# Patient Record
Sex: Male | Born: 1975 | Race: White | Hispanic: No | Marital: Married | Smoking: Current every day smoker
Health system: Southern US, Community
[De-identification: ages and names within clinical notes are randomized; demographics above are authoritative.]

## PROBLEM LIST (undated history)

## (undated) HISTORY — PX: CARPAL TUNNEL RELEASE: SHX101

---

## 2014-07-13 ENCOUNTER — Emergency Department (HOSPITAL_COMMUNITY): Payer: BC Managed Care – PPO

## 2014-07-13 ENCOUNTER — Emergency Department (HOSPITAL_COMMUNITY)
Admission: EM | Admit: 2014-07-13 | Discharge: 2014-07-13 | Disposition: A | Discharge status: Home / Self Care | Payer: BC Managed Care – PPO | Attending: Emergency Medicine | Admitting: Emergency Medicine

## 2014-07-13 ENCOUNTER — Encounter (HOSPITAL_COMMUNITY): Payer: Self-pay | Admitting: Emergency Medicine

## 2014-07-13 DIAGNOSIS — W278XXA Contact with other nonpowered hand tool, initial encounter: Secondary | ICD-10-CM | POA: Insufficient documentation

## 2014-07-13 DIAGNOSIS — Z23 Encounter for immunization: Secondary | ICD-10-CM | POA: Insufficient documentation

## 2014-07-13 DIAGNOSIS — Y929 Unspecified place or not applicable: Secondary | ICD-10-CM | POA: Insufficient documentation

## 2014-07-13 DIAGNOSIS — F17200 Nicotine dependence, unspecified, uncomplicated: Secondary | ICD-10-CM | POA: Insufficient documentation

## 2014-07-13 DIAGNOSIS — S61209A Unspecified open wound of unspecified finger without damage to nail, initial encounter: Secondary | ICD-10-CM | POA: Insufficient documentation

## 2014-07-13 DIAGNOSIS — Y9389 Activity, other specified: Secondary | ICD-10-CM | POA: Insufficient documentation

## 2014-07-13 DIAGNOSIS — S61411A Laceration without foreign body of right hand, initial encounter: Secondary | ICD-10-CM

## 2014-07-13 MED ORDER — DIPHTH-ACELL PERTUSSIS-TETANUS 25-58-10 LF-MCG/0.5 IM SUSP
0.5000 mL | Freq: Once | INTRAMUSCULAR | Status: AC
  Administered 2014-07-13: 0.5 mL via INTRAMUSCULAR
  Filled 2014-07-13: qty 0.5

## 2014-07-13 MED ORDER — CEPHALEXIN 500 MG PO CAPS: 500.0000 mg | ORAL_CAPSULE | Freq: Two times a day (BID) | ORAL | Status: DC

## 2014-07-13 MED ORDER — HYDROCODONE-ACETAMINOPHEN 5-325 MG PO TABS
1.0000 | ORAL_TABLET | Freq: Once | ORAL | Status: AC
  Administered 2014-07-13: 1 via ORAL
  Filled 2014-07-13: qty 1

## 2014-07-13 MED ORDER — LIDOCAINE HCL (PF) 2 % IJ SOLN
INTRAMUSCULAR | Status: AC
  Filled 2014-07-13: qty 10

## 2014-07-13 MED ORDER — BACITRACIN-NEOMYCIN-POLYMYXIN 400-5-5000 EX OINT
TOPICAL_OINTMENT | CUTANEOUS | Status: AC
  Filled 2014-07-13: qty 1

## 2014-07-13 MED ORDER — CEPHALEXIN 500 MG PO CAPS
500.0000 mg | ORAL_CAPSULE | Freq: Once | ORAL | Status: AC
  Administered 2014-07-13: 500 mg via ORAL
  Filled 2014-07-13: qty 1

## 2014-07-13 NOTE — Discharge Instructions (Signed)
Sutures out in 10 days.  Clean laceration twice a day with soap and water

## 2014-07-13 NOTE — ED Notes (Signed)
Pt updated on wait- radiology downtime, xray results delayed

## 2014-07-13 NOTE — ED Provider Notes (Signed)
CSN: [id number]     Arrival date & time 07/13/14  1217 History  This chart was scribed for Jared LennertJoseph L Lurie Mullane, MD by Leona CarryG. Clay Sherrill, ED Scribe. The patient was seen in [facility]. The patient's care was started at 1:17 PM.     Chief Complaint  Patient presents with  . Laceration   Patient is a 38 y.o. male presenting with skin laceration. The history is provided by the patient. No language interpreter was used.  Laceration Location:  Hand Hand laceration location:  R finger Length (cm):  1 cm Bleeding: controlled   Injury mechanism: chainsaw. Ineffective treatments:  None tried Tetanus status:  Out of date  HPI Comments: Jared GandyJohn D Lindsey is a 38 y.o. male who presents to the Emergency Department complaining of a laceration to his right thumb and index finger. Patient reports that the laceration occurred immediately PTA when he was working with a chainsaw at home. He reports that his tetanus is out of date.   Patient does not have a PCP.   History reviewed. No pertinent past medical history. Past Surgical History  Procedure Laterality Date  . Carpal tunnel release     No family history on file. History  Substance Use Topics  . Smoking status: Current Every Day Smoker  . Smokeless tobacco: Current User  . Alcohol Use: Yes     Comment: social     Review of Systems  Constitutional: Negative for appetite change and fatigue.  HENT: Negative for congestion, ear discharge and sinus pressure.   Eyes: Negative for discharge.  Respiratory: Negative for cough.   Cardiovascular: Negative for chest pain.  Gastrointestinal: Negative for abdominal pain and diarrhea.  Genitourinary: Negative for frequency and hematuria.  Musculoskeletal: Negative for back pain.  Skin: Positive for wound. Negative for rash.  Neurological: Negative for seizures and headaches.  Psychiatric/Behavioral: Negative for hallucinations.      Allergies  Erythromycin  Home Medications   Prior to Admission  medications   Not on File   Triage Vitals: BP 121/77  Pulse 94  Temp(Src) 98.7 F (37.1 C) (Oral)  Resp 18  Ht 6\' 2"  (1.88 m)  Wt 220 lb (99.791 kg)  BMI 28.23 kg/m2  SpO2 98% Physical Exam  Constitutional: He is oriented to person, place, and time. He appears well-developed.  HENT:  Head: Normocephalic.  Eyes: Conjunctivae are normal.  Neck: No tracheal deviation present.  Cardiovascular:  No murmur heard. Musculoskeletal: Normal range of motion.  Neurological: He is oriented to person, place, and time.  Skin: Skin is warm.  1 cm laceration ventrally to index finger of right hand. Right thumb 1 cm laceration posterior. Neurovascular exam normal on both fingers.   Psychiatric: He has a normal mood and affect.    ED Course  LACERATION REPAIR Date/Time: 07/13/2014 3:31 PM Performed by: Hiroki Wint L Authorized by: Bethann BerkshireZAMMIT, Angella Montas L Comments: Pt had  Two  1.5 cm lacerations.  One on right index and one on right thumb.  Both lacerations were numbed with lido no epi.  Cleaned with betadine and 3,  4-0 nylon sutures were used to close each laceration.  The pt tolerated the procedure well   (including critical care time) DIAGNOSTIC STUDIES: Oxygen Saturation is 97% on room air, normal by my interpretation.    COORDINATION OF CARE: 1:19 PM-Discussed treatment plan which includes Infanrix, Vicodin, and a right hand x-ray with pt at bedside and pt agreed to plan.     Labs Review Labs Reviewed -  No data to display  Imaging Review No results found.   EKG Interpretation None      MDM   Final diagnoses:  None    The chart was scribed for me under my direct supervision.  I personally performed the history, physical, and medical decision making and all procedures in the evaluation of this patient.Jared Lennert, MD 07/13/14 [phone]

## 2014-07-13 NOTE — ED Notes (Signed)
Unable to take temp pt drinking water.

## 2014-07-13 NOTE — ED Notes (Signed)
Dr Estell HarpinZammit in room with patient putting in stiches unable to do vitals at this time.

## 2014-07-13 NOTE — ED Notes (Signed)
Pt was using a skil saw when it "kicked" back cutting his right thumb and right index finger, cms intact distal, laceration to right index finger continues to bleed, pressure applied,

## 2016-08-31 ENCOUNTER — Ambulatory Visit (INDEPENDENT_AMBULATORY_CARE_PROVIDER_SITE_OTHER): Payer: BLUE CROSS/BLUE SHIELD

## 2016-08-31 ENCOUNTER — Ambulatory Visit (HOSPITAL_COMMUNITY)
Admission: RE | Admit: 2016-08-31 | Discharge: 2016-08-31 | Disposition: A | Discharge status: Home / Self Care | Payer: BLUE CROSS/BLUE SHIELD | Source: Ambulatory Visit | Attending: Orthopaedic Surgery | Admitting: Orthopaedic Surgery

## 2016-08-31 ENCOUNTER — Ambulatory Visit (INDEPENDENT_AMBULATORY_CARE_PROVIDER_SITE_OTHER): Payer: Self-pay | Admitting: Orthopaedic Surgery

## 2016-08-31 ENCOUNTER — Encounter: Payer: Self-pay | Admitting: Orthopaedic Surgery

## 2016-08-31 VITALS — BP 138/92 | HR 92 | Temp 98.1°F | Ht 74.0 in | Wt 227.0 lb

## 2016-08-31 DIAGNOSIS — M2241 Chondromalacia patellae, right knee: Secondary | ICD-10-CM | POA: Insufficient documentation

## 2016-08-31 DIAGNOSIS — M25561 Pain in right knee: Secondary | ICD-10-CM

## 2016-08-31 DIAGNOSIS — X58XXXA Exposure to other specified factors, initial encounter: Secondary | ICD-10-CM | POA: Insufficient documentation

## 2016-08-31 DIAGNOSIS — F17210 Nicotine dependence, cigarettes, uncomplicated: Secondary | ICD-10-CM

## 2016-08-31 DIAGNOSIS — S76111A Strain of right quadriceps muscle, fascia and tendon, initial encounter: Secondary | ICD-10-CM | POA: Diagnosis not present

## 2016-08-31 DIAGNOSIS — S76121A Laceration of right quadriceps muscle, fascia and tendon, initial encounter: Secondary | ICD-10-CM | POA: Diagnosis not present

## 2016-08-31 MED ORDER — HYDROCODONE-ACETAMINOPHEN 7.5-325 MG PO TABS: ORAL_TABLET | ORAL | 0 refills | Status: AC

## 2016-08-31 NOTE — Progress Notes (Signed)
Patient KG:MWNU:Lupe D Tung, male DOB:[birth year], 40 y.o. UVO:[id number]  Chief Complaint  Patient presents with  . Knee Pain    right knee pain    HPI  Beulah GandyJohn D Langford is a 40 y.o. male who injured his right knee on his tractor a couple of days ago.  He hit his right knee.  He had pain for a few minutes but then was able to keep going.  He limped to the house.  He used ice.  Yesterday he awoke with pain but kept going.  He found he was limping severely and if he bends his knee he has marked pain right above the patella on the right knee.  He has swelling.  He has great difficulty walking.  He has used ice, rest and elevation as well as Advil with no help.  Today the pain was so severe he finally listened to his wife and called for an appointment.  He can hardly walk.  He is a smoker.  I have told him he needs to cut back or quit.  He will consider this. HPI  Body mass index is 29.15 kg/m.  ROS  Review of Systems  HENT: Negative for congestion.   Respiratory: Negative for cough and shortness of breath.   Cardiovascular: Negative for chest pain and leg swelling.  Endocrine: Negative for cold intolerance.  Musculoskeletal: Positive for arthralgias, gait problem and joint swelling.  Allergic/Immunologic: Negative for environmental allergies.    History reviewed. No pertinent past medical history.  Past Surgical History:  Procedure Laterality Date  . CARPAL TUNNEL RELEASE      He has recent spider bite and is on antibiotics for that.  History of heart disease and hypertension in the family.  Social History Social History  Substance Use Topics  . Smoking status: Current Every Day Smoker  . Smokeless tobacco: Current User    Types: Snuff  . Alcohol use Yes     Comment: social     Allergies  Allergen Reactions  . Erythromycin Nausea And Vomiting    Current Outpatient Prescriptions  Medication Sig Dispense Refill  . sulfamethoxazole-trimethoprim (BACTRIM DS,SEPTRA DS)  800-160 MG tablet Take 1 tablet by mouth 2 (two) times daily.    Marland Kitchen. HYDROcodone-acetaminophen (NORCO) 7.5-325 MG tablet One every four hours for pain as needed.  Do not drive car or operate machinery while taking this medicine.  Must last 14 days. 56 tablet 0   No current facility-administered medications for this visit.      Physical Exam  Blood pressure (!) 138/92, pulse 92, temperature 98.1 F (36.7 C), height 6\' 2"  (1.88 m), weight 227 lb (103 kg).  Constitutional: overall normal hygiene, normal nutrition, well developed, normal grooming, normal body habitus. Assistive device:none  Musculoskeletal: gait and station Limp marked on the right, muscle tone and strength are normal, no tremors or atrophy is present.  .  Neurological: coordination overall normal.  Deep tendon reflex/nerve stretch intact.  Sensation normal.  Cranial nerves II-XII intact.   Skin:   Normal overall no scars, lesions, ulcers or rashes. No psoriasis.  Psychiatric: Alert and oriented x 3.  Recent memory intact, remote memory unclear.  Normal mood and affect. Well groomed.  Good eye contact.  Cardiovascular: overall no swelling, no varicosities, no edema bilaterally, normal temperatures of the legs and arms, no clubbing, cyanosis and good capillary refill.  Lymphatic: palpation is normal.  The right knee is very, very tender over the patella proximally with swelling.  I feel  a small defect in the quadriceps tendon insertion on the patella.  He is so tender I cannot palpate as deeply as I would like.  He cannot bend the knee past 5 degrees without severe pain.  He cannot do a straight leg raise with the right knee.  He is in marked pain.  I am very concerned about a tear of the quadriceps tendon at the patella level.  I will order an urgent MRI of the knee.  He may need surgery.  He could have a small hematoma and not a tear or have an incomplete tear.  The patient has been educated about the nature of the problem(s)  and counseled on treatment options.  The patient appeared to understand what I have discussed and is in agreement with it.  X-rays were done and reported separately.  Encounter Diagnoses  Name Primary?  . Right knee pain Yes  . Quadriceps tendon rupture, right, initial encounter   . Cigarette nicotine dependence without complication     PLAN Call if any problems.  Precautions discussed.  Knee immobilizer given.  Rx for crutches given.  Rx for pain given with instructions.  Return to clinic tomorrow. To get MRI tonight as urgent add on procedure.   Electronically Signed Darreld Mclean, MD 9/20/20173:51 PM

## 2016-08-31 NOTE — Patient Instructions (Signed)
Wear Knee Immobilizer  Use Ice  Get crutches  MRI of the right knee  Call if worse.

## 2016-09-01 ENCOUNTER — Ambulatory Visit (INDEPENDENT_AMBULATORY_CARE_PROVIDER_SITE_OTHER): Payer: BLUE CROSS/BLUE SHIELD | Admitting: Orthopaedic Surgery

## 2016-09-01 ENCOUNTER — Encounter: Payer: Self-pay | Admitting: Orthopaedic Surgery

## 2016-09-01 VITALS — Ht 74.0 in | Wt 227.0 lb

## 2016-09-01 DIAGNOSIS — S76111D Strain of right quadriceps muscle, fascia and tendon, subsequent encounter: Secondary | ICD-10-CM | POA: Diagnosis not present

## 2016-09-01 NOTE — Progress Notes (Signed)
Patient WU:JWJX:Jared Lindsey, male DOB:1976/02/21, 40 y.o. BJY:[id number]  Chief Complaint  Patient presents with  . Follow-up    right knee pain, mri results    HPI  Jared Lindsey is a 40 y.o. male who has marked right knee pain above patella.  I saw him yesterday. I was concerned about a quadriceps tendon rupture or partial tear.  He got his MRI last evening.  It shows: IMPRESSION: 1. Severe soft tissue edema superficial to the distal quadriceps tendon and proximal patella with moderate tendinosis/contusion with a small partial tear of the quadriceps tendon insertion along the lateral aspect. 2. Mild partial-thickness cartilage loss of the medial femoral condyle and medial tibial plateau. 3. 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  I have explained the findings to him.  I want him to continue the knee immobilizer for now.  His pain is still there.  I want the swelling to decrease.  I will see him in one week.  If he gets more pain or worse or feels there is a change for the worse, he is to go to ER or call here if we are open.  HPI  Body mass index is 29.15 kg/m.  ROS  Review of Systems  No past medical history on file.  Past Surgical History:  Procedure Laterality Date  . CARPAL TUNNEL RELEASE      No family history on file.  Social History Social History  Substance Use Topics  . Smoking status: Current Every Day Smoker  . Smokeless tobacco: Current User    Types: Snuff  . Alcohol use Yes     Comment: social     Allergies  Allergen Reactions  . Erythromycin Nausea And Vomiting    Current Outpatient Prescriptions  Medication Sig Dispense Refill  . HYDROcodone-acetaminophen (NORCO) 7.5-325 MG tablet One every four hours for pain as needed.  Do not drive car or operate machinery while taking this medicine.  Must last 14 days. 56 tablet 0  . sulfamethoxazole-trimethoprim (BACTRIM DS,SEPTRA DS) 800-160 MG tablet Take 1 tablet by mouth 2 (two) times daily.      No current facility-administered medications for this visit.      Physical Exam  Height 6\' 2"  (1.88 m), weight 227 lb (103 kg).  Constitutional: overall normal hygiene, normal nutrition, well developed, normal grooming, normal body habitus. Assistive device:none  Musculoskeletal: gait and station Limp right, muscle tone and strength are normal, no tremors or atrophy is present.  .  Neurological: coordination overall normal.  Deep tendon reflex/nerve stretch intact.  Sensation normal.  Cranial nerves II-XII intact.   Skin:   Normal overall no scars, lesions, ulcers or rashes. No psoriasis.  Psychiatric: Alert and oriented x 3.  Recent memory intact, remote memory unclear.  Normal mood and affect. Well groomed.  Good eye contact.  Cardiovascular: overall no swelling, no varicosities, no edema bilaterally, normal temperatures of the legs and arms, no clubbing, cyanosis and good capillary refill.  Lymphatic: palpation is normal.  Right knee is tender and swollen above the patella area.  It is not red.  He cannot bend it past 5 degrees and cannot straight leg raise.  NV intact.  Knee Immobilizer is intact.  The patient has been educated about the nature of the problem(s) and counseled on treatment options.  The patient appeared to understand what I have discussed and is in agreement with it.  Encounter Diagnosis  Name Primary?  . Quadriceps tendon rupture, right, subsequent encounter Yes  PLAN Call if any problems.  Precautions discussed.  Continue current medications.   Return to clinic 1 week   Electronically Signed Darreld Mclean, MD 9/21/20174:02 PM

## 2016-09-07 ENCOUNTER — Ambulatory Visit (INDEPENDENT_AMBULATORY_CARE_PROVIDER_SITE_OTHER): Payer: BLUE CROSS/BLUE SHIELD | Admitting: Orthopaedic Surgery

## 2016-09-07 ENCOUNTER — Encounter: Payer: Self-pay | Admitting: Orthopaedic Surgery

## 2016-09-07 VITALS — BP 124/83 | HR 67 | Temp 97.5°F | Ht 74.0 in | Wt 226.0 lb

## 2016-09-07 DIAGNOSIS — M25561 Pain in right knee: Secondary | ICD-10-CM | POA: Diagnosis not present

## 2016-09-07 DIAGNOSIS — S76111D Strain of right quadriceps muscle, fascia and tendon, subsequent encounter: Secondary | ICD-10-CM | POA: Diagnosis not present

## 2016-09-07 NOTE — Progress Notes (Signed)
Patient Jared Lindsey, male DOB:31-May-1976, 40 y.o. EAV:[id number]  Chief Complaint  Patient presents with  . Follow-up    Follow up on right knee.    HPI  Hakim Minniefield Tiffany is a 40 y.o. male who has partial rupture of the right quadriceps tendon small.  He is much better.  He has no swelling now.  He has been using the knee immobilizer.  I wanted him to come today to also see Dr. Romeo Apple.  I will be out of town next week and wanted Dr. Romeo Apple to see him as well so he would have a baseline in case of any problem.  He can now straight leg raise and has no swelling. HPI  Body mass index is 29.02 kg/m.  ROS  Review of Systems  HENT: Negative for congestion.   Respiratory: Negative for cough and shortness of breath.   Cardiovascular: Negative for chest pain and leg swelling.  Endocrine: Negative for cold intolerance.  Musculoskeletal: Positive for arthralgias, gait problem and joint swelling.  Allergic/Immunologic: Negative for environmental allergies.    History reviewed. No pertinent past medical history.  Past Surgical History:  Procedure Laterality Date  . CARPAL TUNNEL RELEASE      History reviewed. No pertinent family history.  Social History Social History  Substance Use Topics  . Smoking status: Current Every Day Smoker  . Smokeless tobacco: Current User    Types: Snuff  . Alcohol use Yes     Comment: social     Allergies  Allergen Reactions  . Erythromycin Nausea And Vomiting    Current Outpatient Prescriptions  Medication Sig Dispense Refill  . HYDROcodone-acetaminophen (NORCO) 7.5-325 MG tablet One every four hours for pain as needed.  Do not drive car or operate machinery while taking this medicine.  Must last 14 days. 56 tablet 0  . sulfamethoxazole-trimethoprim (BACTRIM DS,SEPTRA DS) 800-160 MG tablet Take 1 tablet by mouth 2 (two) times daily.     No current facility-administered medications for this visit.      Physical Exam  Blood  pressure 124/83, pulse 67, temperature 97.5 F (36.4 C), height 6\' 2"  (1.88 m), weight 226 lb (102.5 kg).  Constitutional: overall normal hygiene, normal nutrition, well developed, normal grooming, normal body habitus. Assistive device:knee immobilizer right  Musculoskeletal: gait and station Limp right, muscle tone and strength are normal, no tremors or atrophy is present.  .  Neurological: coordination overall normal.  Deep tendon reflex/nerve stretch intact.  Sensation normal.  Cranial nerves II-XII intact.   Skin:   Normal overall no scars, lesions, ulcers or rashes. No psoriasis.  Psychiatric: Alert and oriented x 3.  Recent memory intact, remote memory unclear.  Normal mood and affect. Well groomed.  Good eye contact.  Cardiovascular: overall no swelling, no varicosities, no edema bilaterally, normal temperatures of the legs and arms, no clubbing, cyanosis and good capillary refill.  Lymphatic: palpation is normal.  His right knee has little swelling at all today.  He has no pain.  He can straight leg raise.  He has no defect felt in quadriceps tendon.  I do not feel he needs surgery as long as his knee continues to improve.  Precautions discussed in detail.  Dr. Romeo Apple saw him and agrees.  The patient has been educated about the nature of the problem(s) and counseled on treatment options.  The patient appeared to understand what I have discussed and is in agreement with it.  Encounter Diagnoses  Name Primary?  . Quadriceps  tendon rupture, right, subsequent encounter Yes  . Right knee pain     PLAN Call if any problems.  Precautions discussed.  Continue current medications.   Return to clinic 2 weeks   Continue knee immobilizer.  Electronically Signed Darreld McleanWayne Jionni Helming, MD 9/27/201710:01 AM

## 2016-09-08 ENCOUNTER — Ambulatory Visit: Payer: BLUE CROSS/BLUE SHIELD | Admitting: Orthopaedic Surgery

## 2016-09-21 ENCOUNTER — Ambulatory Visit (INDEPENDENT_AMBULATORY_CARE_PROVIDER_SITE_OTHER): Payer: BLUE CROSS/BLUE SHIELD | Admitting: Orthopaedic Surgery

## 2016-09-21 ENCOUNTER — Encounter: Payer: Self-pay | Admitting: Orthopaedic Surgery

## 2016-09-21 VITALS — BP 137/86 | HR 80 | Temp 97.9°F | Ht 74.0 in | Wt 226.0 lb

## 2016-09-21 DIAGNOSIS — S76111D Strain of right quadriceps muscle, fascia and tendon, subsequent encounter: Secondary | ICD-10-CM

## 2016-09-21 DIAGNOSIS — F17210 Nicotine dependence, cigarettes, uncomplicated: Secondary | ICD-10-CM

## 2016-09-21 NOTE — Patient Instructions (Signed)
Return to work on Monday.  

## 2016-09-21 NOTE — Progress Notes (Signed)
Patient ZO:XWRU D Lindsey, male DOB:1976-01-07, 40 y.o. EAV:[id number]  Chief Complaint  Patient presents with  . Follow-up    Right knee pain    HPI  Jared Lindsey is a 40 y.o. male who had a partial incomplete small quadriceps tendon tear right knee.  He is much improved.  He can bend his knee with no pain, he can leg lift with no pain.  His circumference of the right thigh five inches above the patella is 17 3/8 inches, on the left it is 18 7/8 inches.  I have told him it will take time to get his full strength back and went over precautions.  He understands. HPI  Body mass index is 29.02 kg/m.  ROS  Review of Systems  HENT: Negative for congestion.   Respiratory: Negative for cough and shortness of breath.   Cardiovascular: Negative for chest pain and leg swelling.  Endocrine: Negative for cold intolerance.  Musculoskeletal: Positive for arthralgias, gait problem and joint swelling.  Allergic/Immunologic: Negative for environmental allergies.    History reviewed. No pertinent past medical history.  Past Surgical History:  Procedure Laterality Date  . CARPAL TUNNEL RELEASE      History reviewed. No pertinent family history.  Social History Social History  Substance Use Topics  . Smoking status: Current Every Day Smoker  . Smokeless tobacco: Current User    Types: Snuff  . Alcohol use Yes     Comment: social     Allergies  Allergen Reactions  . Erythromycin Nausea And Vomiting    Current Outpatient Prescriptions  Medication Sig Dispense Refill  . HYDROcodone-acetaminophen (NORCO) 7.5-325 MG tablet One every four hours for pain as needed.  Do not drive car or operate machinery while taking this medicine.  Must last 14 days. 56 tablet 0  . sulfamethoxazole-trimethoprim (BACTRIM DS,SEPTRA DS) 800-160 MG tablet Take 1 tablet by mouth 2 (two) times daily.     No current facility-administered medications for this visit.      Physical Exam  Blood pressure  137/86, pulse 80, temperature 97.9 F (36.6 C), height 6\' 2"  (1.88 m), weight 226 lb (102.5 kg).  Constitutional: overall normal hygiene, normal nutrition, well developed, normal grooming, normal body habitus. Assistive device:none  Musculoskeletal: gait and station Limp right, muscle tone and strength are normal, no tremors or atrophy is present.  .  Neurological: coordination overall normal.  Deep tendon reflex/nerve stretch intact.  Sensation normal.  Cranial nerves II-XII intact.   Skin:   Normal overall no scars, lesions, ulcers or rashes. No psoriasis.  Psychiatric: Alert and oriented x 3.  Recent memory intact, remote memory unclear.  Normal mood and affect. Well groomed.  Good eye contact.  Cardiovascular: overall no swelling, no varicosities, no edema bilaterally, normal temperatures of the legs and arms, no clubbing, cyanosis and good capillary refill.  Lymphatic: palpation is normal.  Right knee has full flexion and extension.  He has some atrophy of the right thigh, see measurements above.  NV intact. He has no pain.  He has no defect.  He has no swelling or redness.  The patient has been educated about the nature of the problem(s) and counseled on treatment options.  The patient appeared to understand what I have discussed and is in agreement with it.  Encounter Diagnoses  Name Primary?  . Quadriceps tendon rupture, right, subsequent encounter Yes  . Cigarette nicotine dependence without complication     PLAN Call if any problems.  Precautions discussed.  Continue current medications.   Return to clinic 1 month   May return to work Monday.  Electronically Signed Darreld McleanWayne Haylin Camilli, MD 10/11/201710:11 AM

## 2016-10-19 ENCOUNTER — Ambulatory Visit: Payer: BLUE CROSS/BLUE SHIELD | Admitting: Orthopaedic Surgery

## 2020-11-23 ENCOUNTER — Emergency Department (HOSPITAL_COMMUNITY): Payer: No Typology Code available for payment source

## 2020-11-23 ENCOUNTER — Emergency Department (HOSPITAL_COMMUNITY)
Admission: EM | Admit: 2020-11-23 | Discharge: 2020-11-23 | Disposition: A | Discharge status: Home / Self Care | Payer: No Typology Code available for payment source | Attending: Emergency Medicine | Admitting: Emergency Medicine

## 2020-11-23 ENCOUNTER — Encounter (HOSPITAL_COMMUNITY): Payer: Self-pay | Admitting: Emergency Medicine

## 2020-11-23 ENCOUNTER — Other Ambulatory Visit: Payer: Self-pay

## 2020-11-23 DIAGNOSIS — S60552A Superficial foreign body of left hand, initial encounter: Secondary | ICD-10-CM | POA: Diagnosis not present

## 2020-11-23 DIAGNOSIS — F17200 Nicotine dependence, unspecified, uncomplicated: Secondary | ICD-10-CM | POA: Diagnosis not present

## 2020-11-23 DIAGNOSIS — S6992XA Unspecified injury of left wrist, hand and finger(s), initial encounter: Secondary | ICD-10-CM

## 2020-11-23 DIAGNOSIS — W228XXA Striking against or struck by other objects, initial encounter: Secondary | ICD-10-CM | POA: Diagnosis not present

## 2020-11-23 DIAGNOSIS — S60512A Abrasion of left hand, initial encounter: Secondary | ICD-10-CM | POA: Insufficient documentation

## 2020-11-23 DIAGNOSIS — S00511A Abrasion of lip, initial encounter: Secondary | ICD-10-CM | POA: Insufficient documentation

## 2020-11-23 DIAGNOSIS — Y990 Civilian activity done for income or pay: Secondary | ICD-10-CM | POA: Diagnosis not present

## 2020-11-23 DIAGNOSIS — H9202 Otalgia, left ear: Secondary | ICD-10-CM | POA: Insufficient documentation
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
## 2020-11-23 NOTE — Discharge Instructions (Signed)
You are seen today for hand injury, your x-rays are reassuring.  There is nothing broken or fractured.  If you have any infectious symptoms as we spoke about such as fevers, redness, severe pain or any new or worsening concerning symptoms please come back to the emergency department.  You can have your hand rechecked in the next couple of days by your primary care.  You can take Tylenol as directed on the bottle for pain.

## 2021-09-19 ENCOUNTER — Emergency Department (HOSPITAL_COMMUNITY)
Admission: EM | Admit: 2021-09-19 | Discharge: 2021-09-19 | Disposition: A | Discharge status: Home / Self Care | Payer: BC Managed Care – PPO | Attending: Emergency Medicine | Admitting: Emergency Medicine

## 2021-09-19 ENCOUNTER — Encounter (HOSPITAL_COMMUNITY): Payer: Self-pay | Admitting: Emergency Medicine

## 2021-09-19 ENCOUNTER — Other Ambulatory Visit: Payer: Self-pay

## 2021-09-19 ENCOUNTER — Emergency Department (HOSPITAL_COMMUNITY): Payer: BC Managed Care – PPO

## 2021-09-19 DIAGNOSIS — F17200 Nicotine dependence, unspecified, uncomplicated: Secondary | ICD-10-CM | POA: Diagnosis not present

## 2021-09-19 DIAGNOSIS — M25561 Pain in right knee: Secondary | ICD-10-CM | POA: Insufficient documentation

## 2021-09-19 MED ORDER — CELECOXIB 200 MG PO CAPS: 200.0000 mg | ORAL_CAPSULE | Freq: Two times a day (BID) | ORAL | 0 refills | Status: AC

## 2021-09-19 MED ORDER — METHYLPREDNISOLONE 4 MG PO TBPK: ORAL_TABLET | ORAL | 0 refills | Status: AC

## 2021-09-19 NOTE — ED Provider Notes (Signed)
[facility] EMERGENCY DEPARTMENT Provider Note   CSN: [id number] Arrival date & time: 09/19/21  1057     History Chief Complaint  Patient presents with   Knee Pain    Right.  C/o pain to right knee for one week, notice pain after jumping off tractor.  Rates pain 10/10.      Jared Lindsey is a 45 y.o. male who presents emergency department chief complaint of right knee and calf pain.  Patient states that he has progressively worsening pain on the right lateral side of his leg radiating down the groove in between his gastrocnemius and his extensor tibialis muscle.  He denies any foot pain, back pain, leg pain.  He has no weakness in the leg.  Patient has not take any medications for pain such as Tylenol or Motrin.  He denies any weakness in the lower extremity.  He has a previous history of traumatic quadriceps tendon rupture which was repaired.  He has no longstanding issues with that leg.   Knee Pain     History reviewed. No pertinent past medical history.  Patient Active Problem List   Diagnosis Date Noted   Right knee pain 08/31/2016    Past Surgical History:  Procedure Laterality Date   CARPAL TUNNEL RELEASE         History reviewed. No pertinent family history.  Social History   Tobacco Use   Smoking status: Every Day   Smokeless tobacco: Current    Types: Snuff  Substance Use Topics   Alcohol use: Yes    Comment: social    Drug use: No    Home Medications Prior to Admission medications   Medication Sig Start Date End Date Taking? Authorizing Provider  HYDROcodone-acetaminophen (NORCO) 7.5-325 MG tablet One every four hours for pain as needed.  Do not drive car or operate machinery while taking this medicine.  Must last 14 days. 08/31/16   Darreld Mclean, MD  sulfamethoxazole-trimethoprim (BACTRIM DS,SEPTRA DS) 800-160 MG tablet Take 1 tablet by mouth 2 (two) times daily.    [provider]    Allergies    Erythromycin  Review of Systems    Review of Systems Ten systems reviewed and are negative for acute change, except as noted in the HPI.   Physical Exam Updated Vital Signs BP (!) 150/105 (BP Location: Right Arm)   Pulse 86   Temp 97.7 F (36.5 C) (Oral)   Resp 19   Ht 6\' 2"  (1.88 m)   Wt 106.2 kg   SpO2 96%   BMI 30.07 kg/m   Physical Exam Vitals and nursing note reviewed.  Constitutional:      General: He is not in acute distress.    Appearance: He is well-developed. He is not diaphoretic.  HENT:     Head: Normocephalic and atraumatic.  Eyes:     General: No scleral icterus.    Conjunctiva/sclera: Conjunctivae normal.  Cardiovascular:     Rate and Rhythm: Normal rate and regular rhythm.     Heart sounds: Normal heart sounds.  Pulmonary:     Effort: Pulmonary effort is normal. No respiratory distress.     Breath sounds: Normal breath sounds.  Abdominal:     Palpations: Abdomen is soft.     Tenderness: There is no abdominal tenderness.  Musculoskeletal:     Cervical back: Normal range of motion and neck supple.     Comments: Right knee examination reveals no joint line tenderness, tender to palpation over the  fibular head.  Ipsilateral ankle and hip examination, no weakness at the ankle, no weakness at the knee  Skin:    General: Skin is warm and dry.  Neurological:     Mental Status: He is alert.  Psychiatric:        Behavior: Behavior normal.    ED Results / Procedures / Treatments   Labs (all labs ordered are listed, but only abnormal results are displayed) Labs Reviewed - No data to display  EKG None  Radiology DG Knee Complete 4 Views Right  Result Date: 09/19/2021 CLINICAL DATA:  Right knee injury 1 week ago with persistent pain. EXAM: RIGHT KNEE - COMPLETE 4+ VIEW COMPARISON:  August 31, 2016 FINDINGS: No evidence of fracture, dislocation, or joint effusion. Minimal tibial spine osteophytosis is noted. Soft tissues are unremarkable. IMPRESSION: No acute fracture or dislocation  noted. Electronically Signed   By: Sherian Rein M.D.   On: 09/19/2021 12:34    Procedures Procedures   Medications Ordered in ED Medications - No data to display  ED Course  I have reviewed the triage vital signs and the nursing notes.  Pertinent labs & imaging results that were available during my care of the patient were reviewed by me and considered in my medical decision making (see chart for details).    MDM Rules/Calculators/A&P                           Patient with right lateral knee pain.  I wonder if he has some lateral nerve impingement around the fibular head.  He has no evidence of radiculopathy and has a negative straight leg test.  Patient was given knee sleeve, Celebrex and a Medrol Dosepak.  I will also give the patient close outpatient follow-up with orthopedics.  Discussed outpatient follow-up and return precautions. Final Clinical Impression(s) / ED Diagnoses Final diagnoses:  None    Rx / DC Orders ED Discharge Orders     None        Arthor Captain, PA-C 09/20/21 1117    Horton, Clabe Seal, DO 09/21/21 2347

## 2021-09-19 NOTE — Discharge Instructions (Addendum)
Contact a health care provider if: Your symptoms do not improve in 2-3 months. You have increasing weakness or numbness in your leg or foot.

## 2021-09-23 ENCOUNTER — Ambulatory Visit: Payer: BC Managed Care – PPO | Admitting: Orthopaedic Surgery

## 2022-08-01 IMAGING — DX DG KNEE COMPLETE 4+V*R*
4 series · 4 of 4 positions shown · non-contrast
Comparison: August 31, 2016

CLINICAL DATA: Right knee injury 1 week ago with persistent pain.

EXAM:
RIGHT KNEE - COMPLETE 4+ VIEW

[knee ap]
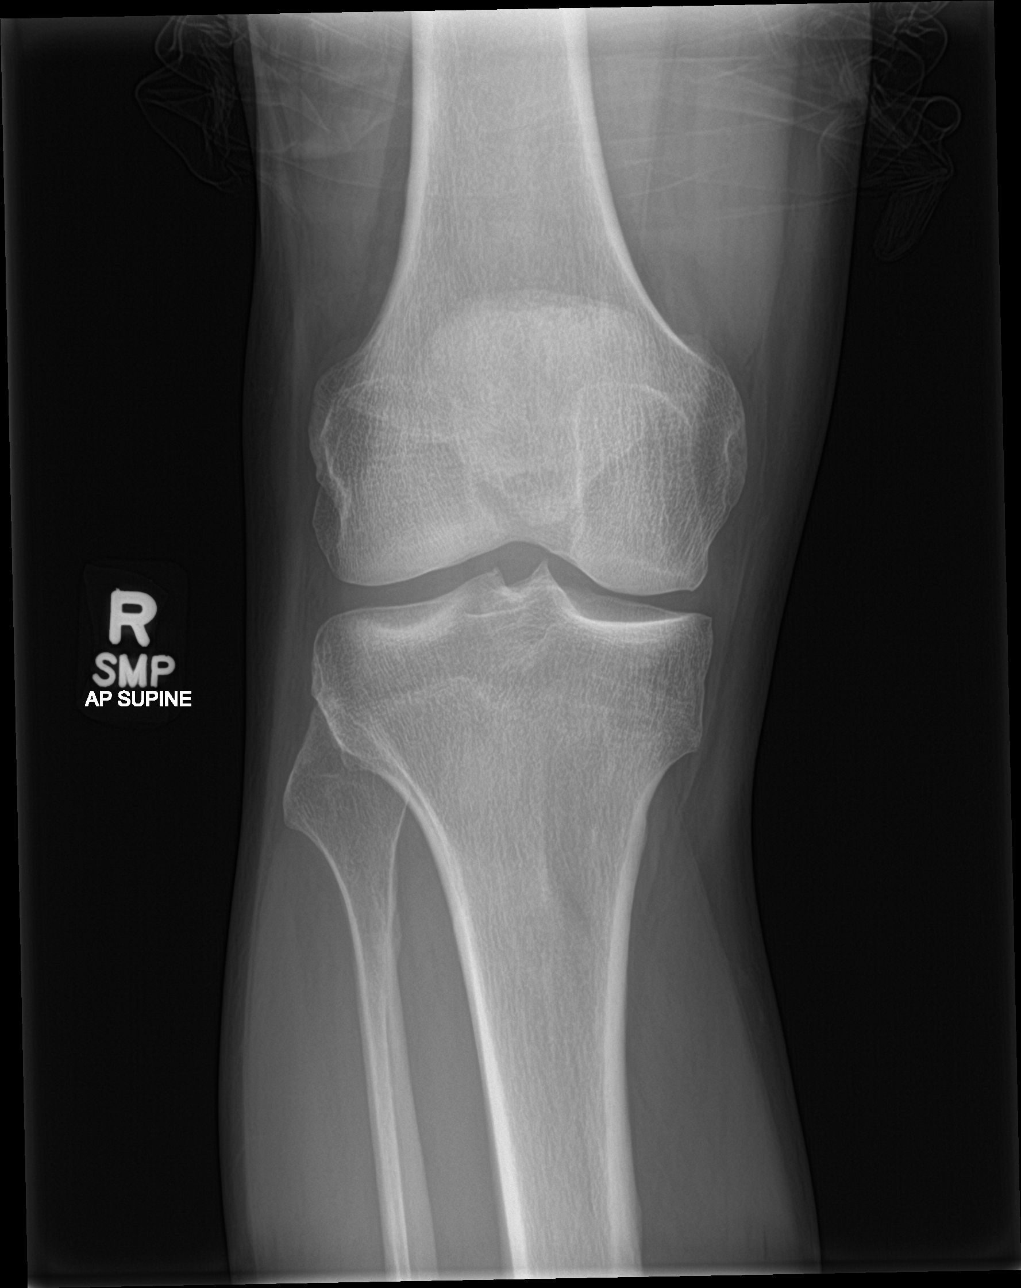

[knee obl (1 of 2)]
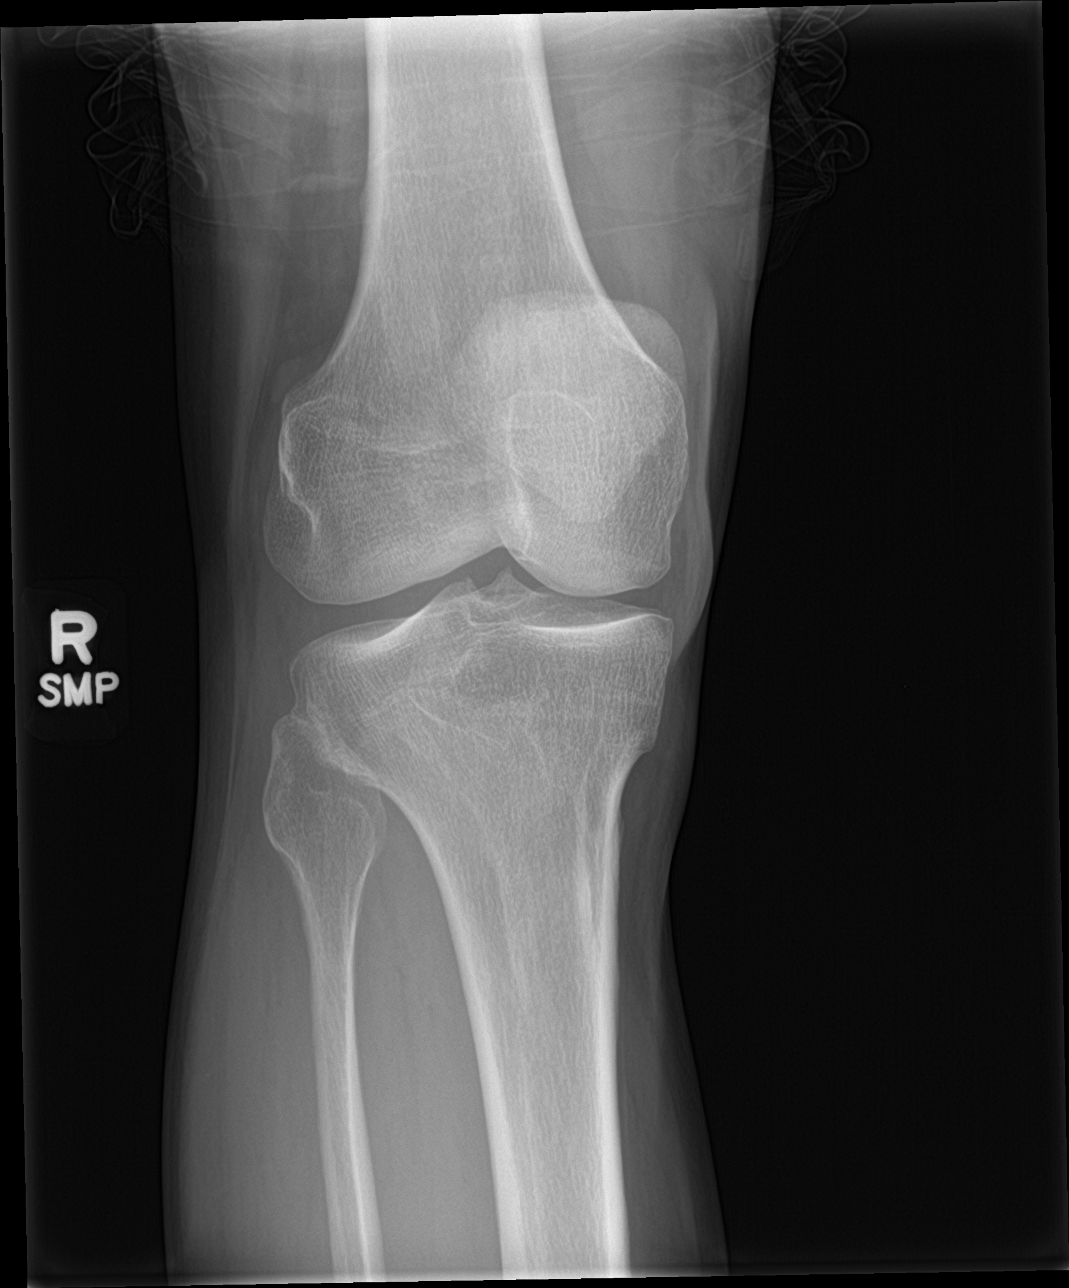

[knee obl (2 of 2)]
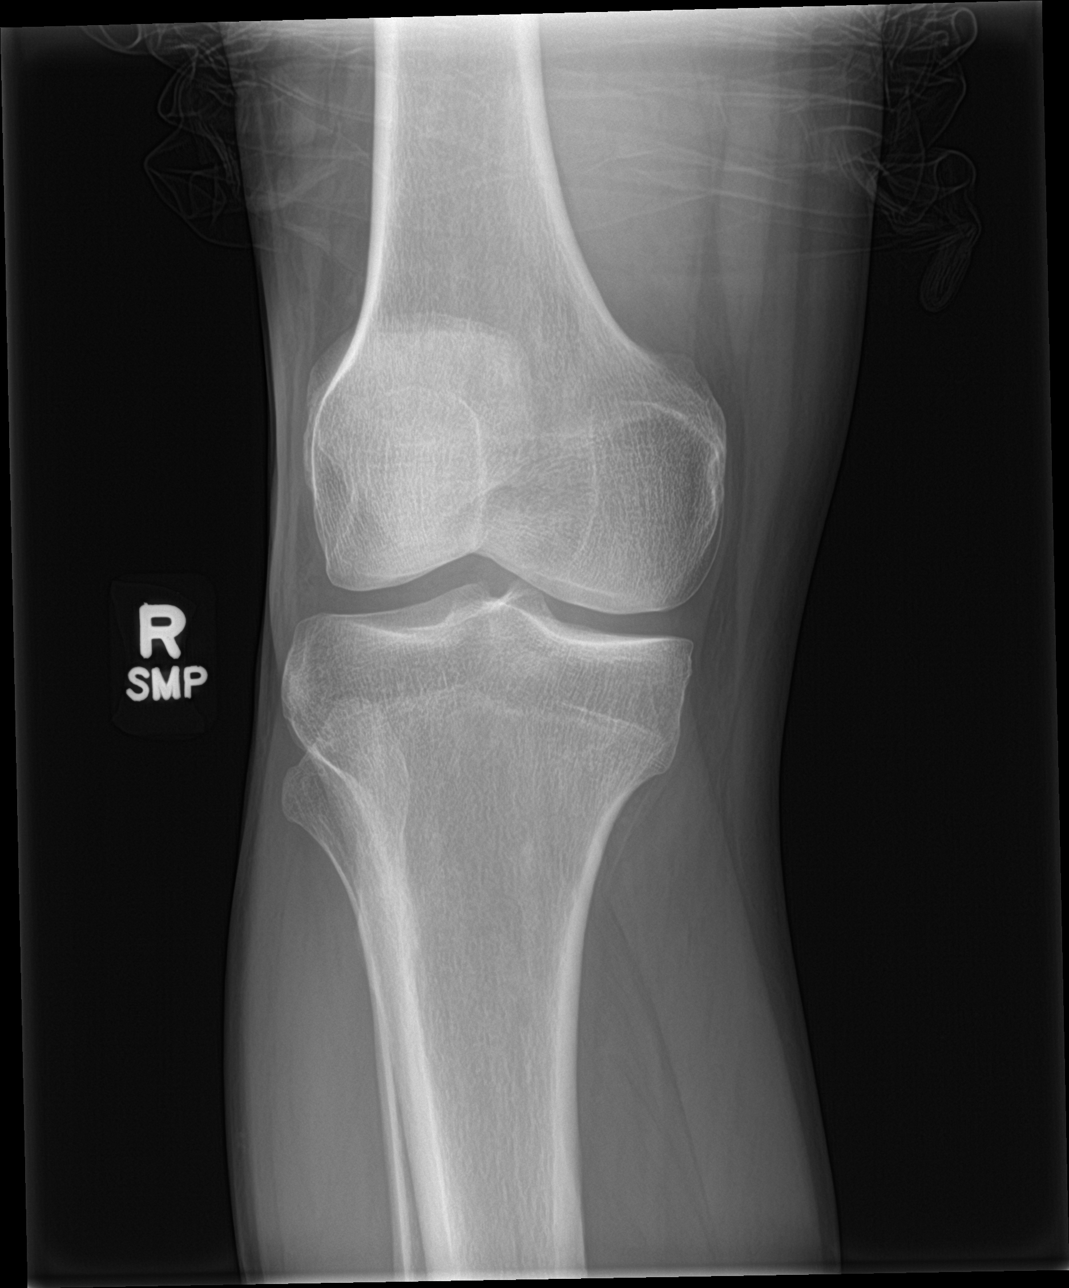

[knee lat]
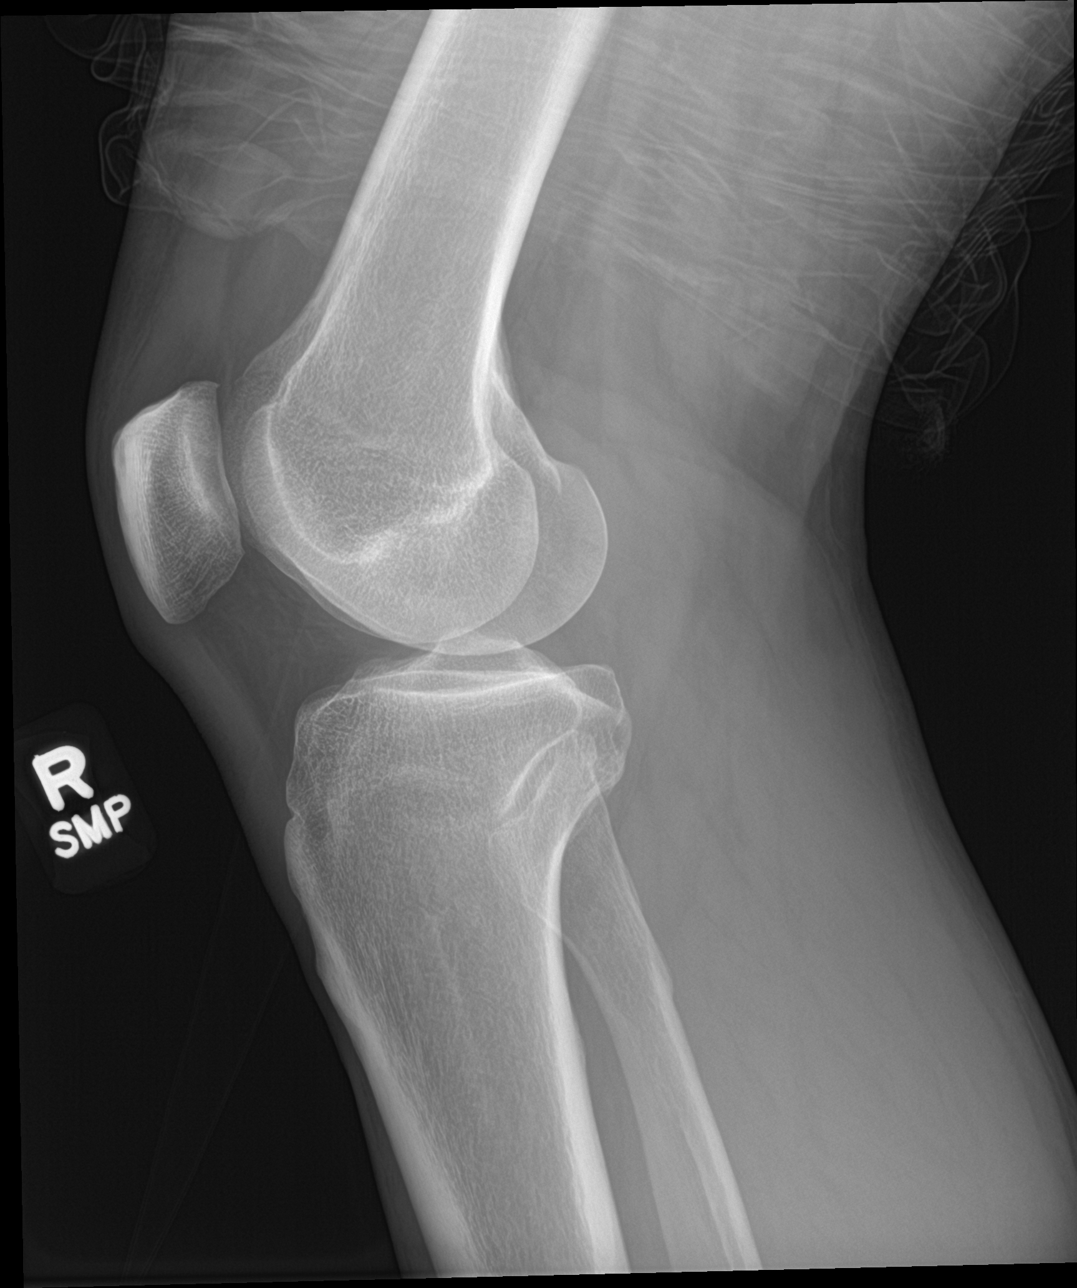

[4 of 4 positions shown; findings below may reference images not displayed]

FINDINGS: No evidence of fracture, dislocation, or joint effusion. Minimal
tibial spine osteophytosis is noted. Soft tissues are unremarkable.
IMPRESSION: No acute fracture or dislocation noted.

## 2023-03-03 ENCOUNTER — Ambulatory Visit
Admission: RE | Admit: 2023-03-03 | Discharge: 2023-03-03 | Disposition: A | Discharge status: Home / Self Care | Payer: BC Managed Care – PPO | Source: Ambulatory Visit | Attending: Urgent Care | Admitting: Urgent Care

## 2023-03-03 VITALS — BP 158/97 | HR 68 | Temp 98.0°F | Resp 18

## 2023-03-03 DIAGNOSIS — L03213 Periorbital cellulitis: Secondary | ICD-10-CM

## 2023-03-03 MED ORDER — AMOXICILLIN-POT CLAVULANATE 875-125 MG PO TABS: 1.0000 | ORAL_TABLET | Freq: Two times a day (BID) | ORAL | 0 refills | Status: AC

## 2023-03-03 MED ORDER — TOBRAMYCIN 0.3 % OP SOLN: 1.0000 [drp] | OPHTHALMIC | 0 refills | Status: AC

## 2023-03-03 NOTE — ED Provider Notes (Signed)
[facility]  Note:  This document was prepared using Dragon voice recognition software and may include unintentional dictation errors.  MRN: [id number] DOB: [birth year]  Subjective:   Jared Lindsey is a 47 y.o. male presenting for 1 day history of acute onset persistent or worsening left upper eyelid pain, swelling.  Patient initially thought it was a stye and tried warm compresses but this made it significantly worse.  Started to drain and became more painful.  Today he woke up and is feeling worse.  No vision changes.  Symptoms were dramatically worse after he mowed the yard yesterday.  No foreign body sensation.  No eye trauma.  Patient was wearing protective eye gear.  No current facility-administered medications for this encounter.  Current Outpatient Medications:    celecoxib (CELEBREX) 200 MG capsule, Take 1 capsule (200 mg total) by mouth 2 (two) times daily., Disp: 20 capsule, Rfl: 0   HYDROcodone-acetaminophen (NORCO) 7.5-325 MG tablet, One every four hours for pain as needed.  Do not drive car or operate machinery while taking this medicine.  Must last 14 days., Disp: 56 tablet, Rfl: 0   methylPREDNISolone (MEDROL DOSEPAK) 4 MG TBPK tablet, Use as directed, Disp: 21 tablet, Rfl: 0   sulfamethoxazole-trimethoprim (BACTRIM DS,SEPTRA DS) 800-160 MG tablet, Take 1 tablet by mouth 2 (two) times daily., Disp: , Rfl:    Allergies  Allergen Reactions   Erythromycin Nausea And Vomiting    History reviewed. No pertinent past medical history.   Past Surgical History:  Procedure Laterality Date   CARPAL TUNNEL RELEASE      History reviewed. No pertinent family history.  Social History   Tobacco Use   Smoking status: Every Day   Smokeless tobacco: Current    Types: Snuff  Substance Use Topics   Alcohol use: Yes    Comment: social    Drug use: No    ROS   Objective:   Vitals: BP (!) 158/97 (BP Location: Right Arm)   Pulse 68   Temp 98 F (36.7 C)  (Oral)   Resp 18   SpO2 98%   Physical Exam Constitutional:      General: He is not in acute distress.    Appearance: Normal appearance. He is well-developed and normal weight. He is not ill-appearing, toxic-appearing or diaphoretic.  HENT:     Head: Normocephalic and atraumatic.     Right Ear: External ear normal.     Left Ear: External ear normal.     Nose: Nose normal.     Mouth/Throat:     Pharynx: Oropharynx is clear.  Eyes:     General: Lids are everted, no foreign bodies appreciated. No scleral icterus.       Right eye: No foreign body, discharge or hordeolum.        Left eye: No foreign body, discharge or hordeolum.     Extraocular Movements: Extraocular movements intact.     Conjunctiva/sclera:     Right eye: Right conjunctiva is not injected. No chemosis, exudate or hemorrhage.    Left eye: Left conjunctiva is injected. No chemosis, exudate or hemorrhage.  Cardiovascular:     Rate and Rhythm: Normal rate.  Pulmonary:     Effort: Pulmonary effort is normal.  Musculoskeletal:     Cervical back: Normal range of motion.  Neurological:     Mental Status: He is alert and oriented to person, place, and time.  Psychiatric:        Mood and Affect: Mood  normal.        Behavior: Behavior normal.        Thought Content: Thought content normal.        Judgment: Judgment normal.     Assessment and Plan :   PDMP not reviewed this encounter.  1. Preseptal cellulitis of left upper eyelid     Patient has obvious preseptal cellulitis of the left upper eyelid with Augmentin.  No obvious stye.  Will avoid warm compresses as it is made his symptoms dramatically worse.  Recommended tobramycin eyedrops as well given the injected left conjunctiva.  Patient has established follow-up with his ophthalmologist.  Counseled patient on potential for adverse effects with medications prescribed/recommended today, ER and return-to-clinic precautions discussed, patient verbalized  understanding.    Jaynee Eagles, PA-C 03/03/23 1006

## 2023-03-03 NOTE — ED Triage Notes (Signed)
Left eye lid swollen since yesterday and eye drainage over night. Denies any vision changes.

## 2024-11-04 ENCOUNTER — Ambulatory Visit: Admitting: Podiatry
